# Patient Record
Sex: Female | Born: 1975 | Hispanic: No | Marital: Married | Smoking: Never smoker
Health system: Southern US, Community
[De-identification: ages and names within clinical notes are randomized; demographics above are authoritative.]

## PROBLEM LIST (undated history)

## (undated) DIAGNOSIS — E7800 Pure hypercholesterolemia, unspecified: Secondary | ICD-10-CM

## (undated) HISTORY — PX: ABDOMINAL HYSTERECTOMY: SHX81

---

## 2010-01-31 ENCOUNTER — Ambulatory Visit: Payer: Self-pay | Admitting: Obstetrics & Gynecology

## 2010-02-01 ENCOUNTER — Encounter: Admission: RE | Admit: 2010-02-01 | Discharge: 2010-05-02 | Payer: Self-pay | Admitting: Obstetrics & Gynecology

## 2010-02-07 ENCOUNTER — Ambulatory Visit: Payer: Self-pay | Admitting: Obstetrics & Gynecology

## 2010-02-07 LAB — CONVERTED CEMR LAB
HCT: 33.1 % — ABNORMAL LOW (ref 36.0–46.0)
Hemoglobin: 10.6 g/dL — ABNORMAL LOW (ref 12.0–15.0)
MCHC: 32 g/dL (ref 30.0–36.0)
MCV: 79.2 fL (ref 78.0–100.0)
Platelets: 214 10*3/uL (ref 150–400)
RBC: 4.18 M/uL (ref 3.87–5.11)
RDW: 13.6 % (ref 11.5–15.5)
WBC: 10.2 10*3/uL (ref 4.0–10.5)

## 2010-02-08 ENCOUNTER — Ambulatory Visit (HOSPITAL_COMMUNITY): Admission: RE | Admit: 2010-02-08 | Discharge: 2010-02-08 | Payer: Self-pay | Admitting: Obstetrics & Gynecology

## 2010-02-14 ENCOUNTER — Ambulatory Visit: Payer: Self-pay | Admitting: Obstetrics & Gynecology

## 2010-02-15 ENCOUNTER — Encounter: Payer: Self-pay | Admitting: Obstetrics & Gynecology

## 2010-02-15 LAB — CONVERTED CEMR LAB
Chlamydia, DNA Probe: NEGATIVE
GC Probe Amp, Genital: NEGATIVE
Trich, Wet Prep: NONE SEEN
Yeast Wet Prep HPF POC: NONE SEEN

## 2010-02-21 ENCOUNTER — Ambulatory Visit: Payer: Self-pay | Admitting: Obstetrics & Gynecology

## 2010-02-28 ENCOUNTER — Ambulatory Visit: Payer: Self-pay | Admitting: Obstetrics & Gynecology

## 2010-03-03 ENCOUNTER — Ambulatory Visit: Payer: Self-pay | Admitting: Obstetrics & Gynecology

## 2010-03-07 ENCOUNTER — Ambulatory Visit: Payer: Self-pay | Admitting: Obstetrics & Gynecology

## 2010-03-10 ENCOUNTER — Ambulatory Visit: Payer: Self-pay | Admitting: Family Medicine

## 2010-03-15 ENCOUNTER — Ambulatory Visit: Payer: Self-pay | Admitting: Obstetrics & Gynecology

## 2010-03-17 ENCOUNTER — Ambulatory Visit: Payer: Self-pay | Admitting: Obstetrics & Gynecology

## 2010-03-17 ENCOUNTER — Ambulatory Visit (HOSPITAL_COMMUNITY): Admission: RE | Admit: 2010-03-17 | Discharge: 2010-03-17 | Payer: Self-pay | Admitting: Obstetrics and Gynecology

## 2010-03-21 ENCOUNTER — Ambulatory Visit: Payer: Self-pay | Admitting: Obstetrics & Gynecology

## 2010-03-21 LAB — CONVERTED CEMR LAB
Chlamydia, DNA Probe: NEGATIVE
GC Probe Amp, Genital: NEGATIVE

## 2010-03-24 ENCOUNTER — Ambulatory Visit: Payer: Self-pay | Admitting: Obstetrics & Gynecology

## 2010-03-28 ENCOUNTER — Ambulatory Visit: Payer: Self-pay | Admitting: Obstetrics & Gynecology

## 2010-03-31 ENCOUNTER — Ambulatory Visit: Payer: Self-pay | Admitting: Family Medicine

## 2010-04-04 ENCOUNTER — Ambulatory Visit: Payer: Self-pay | Admitting: Obstetrics & Gynecology

## 2010-04-05 ENCOUNTER — Inpatient Hospital Stay (HOSPITAL_COMMUNITY): Admission: AD | Admit: 2010-04-05 | Discharge: 2010-04-05 | Payer: Self-pay | Admitting: Obstetrics & Gynecology

## 2010-04-07 ENCOUNTER — Encounter: Payer: Self-pay | Admitting: Obstetrics and Gynecology

## 2010-04-07 ENCOUNTER — Inpatient Hospital Stay (HOSPITAL_COMMUNITY): Admission: AD | Admit: 2010-04-07 | Discharge: 2010-04-09 | Payer: Self-pay | Admitting: Obstetrics and Gynecology

## 2010-04-07 ENCOUNTER — Ambulatory Visit: Payer: Self-pay | Admitting: Family Medicine

## 2010-05-27 ENCOUNTER — Ambulatory Visit: Payer: Self-pay | Admitting: Obstetrics & Gynecology

## 2010-06-22 ENCOUNTER — Encounter: Payer: Self-pay | Admitting: Obstetrics & Gynecology

## 2010-06-22 ENCOUNTER — Ambulatory Visit: Payer: Self-pay | Admitting: Obstetrics and Gynecology

## 2010-06-22 LAB — CONVERTED CEMR LAB
Glucose, 2 hour: 116 mg/dL (ref 70–139)
Glucose, Fasting: 77 mg/dL (ref 70–99)

## 2010-11-06 ENCOUNTER — Encounter: Payer: Self-pay | Admitting: *Deleted

## 2011-01-01 LAB — CBC
HCT: 34.2 % — ABNORMAL LOW (ref 36.0–46.0)
Hemoglobin: 11.3 g/dL — ABNORMAL LOW (ref 12.0–15.0)
MCH: 24.5 pg — ABNORMAL LOW (ref 26.0–34.0)
MCHC: 33 g/dL (ref 30.0–36.0)
MCV: 74.2 fL — ABNORMAL LOW (ref 78.0–100.0)
Platelets: 162 10*3/uL (ref 150–400)
RBC: 4.61 MIL/uL (ref 3.87–5.11)
RDW: 15.6 % — ABNORMAL HIGH (ref 11.5–15.5)
WBC: 9.3 10*3/uL (ref 4.0–10.5)

## 2011-01-01 LAB — GLUCOSE, CAPILLARY
Glucose-Capillary: 119 mg/dL — ABNORMAL HIGH (ref 70–99)
Glucose-Capillary: 137 mg/dL — ABNORMAL HIGH (ref 70–99)
Glucose-Capillary: 155 mg/dL — ABNORMAL HIGH (ref 70–99)
Glucose-Capillary: 197 mg/dL — ABNORMAL HIGH (ref 70–99)
Glucose-Capillary: 39 mg/dL — CL (ref 70–99)
Glucose-Capillary: 51 mg/dL — ABNORMAL LOW (ref 70–99)
Glucose-Capillary: 51 mg/dL — ABNORMAL LOW (ref 70–99)
Glucose-Capillary: 59 mg/dL — ABNORMAL LOW (ref 70–99)
Glucose-Capillary: 64 mg/dL — ABNORMAL LOW (ref 70–99)
Glucose-Capillary: 67 mg/dL — ABNORMAL LOW (ref 70–99)
Glucose-Capillary: 73 mg/dL (ref 70–99)
Glucose-Capillary: 91 mg/dL (ref 70–99)
Glucose-Capillary: 91 mg/dL (ref 70–99)

## 2011-01-01 LAB — POCT URINALYSIS DIP (DEVICE)
Bilirubin Urine: NEGATIVE
Glucose, UA: NEGATIVE mg/dL
Hgb urine dipstick: NEGATIVE
Ketones, ur: NEGATIVE mg/dL
Nitrite: NEGATIVE
Protein, ur: NEGATIVE mg/dL
Specific Gravity, Urine: 1.01 (ref 1.005–1.030)
Urobilinogen, UA: 0.2 mg/dL (ref 0.0–1.0)
pH: 6 (ref 5.0–8.0)

## 2011-01-01 LAB — RPR: RPR Ser Ql: NONREACTIVE

## 2011-01-02 LAB — POCT URINALYSIS DIP (DEVICE)
Bilirubin Urine: NEGATIVE
Bilirubin Urine: NEGATIVE
Bilirubin Urine: NEGATIVE
Bilirubin Urine: NEGATIVE
Glucose, UA: NEGATIVE mg/dL
Glucose, UA: NEGATIVE mg/dL
Glucose, UA: 100 mg/dL — AB
Glucose, UA: 500 mg/dL — AB
Hgb urine dipstick: NEGATIVE
Hgb urine dipstick: NEGATIVE
Hgb urine dipstick: NEGATIVE
Hgb urine dipstick: NEGATIVE
Ketones, ur: NEGATIVE mg/dL
Ketones, ur: NEGATIVE mg/dL
Ketones, ur: NEGATIVE mg/dL
Ketones, ur: NEGATIVE mg/dL
Nitrite: NEGATIVE
Nitrite: NEGATIVE
Nitrite: NEGATIVE
Nitrite: NEGATIVE
Protein, ur: NEGATIVE mg/dL
Protein, ur: NEGATIVE mg/dL
Protein, ur: NEGATIVE mg/dL
Protein, ur: NEGATIVE mg/dL
Specific Gravity, Urine: 1.015 (ref 1.005–1.030)
Specific Gravity, Urine: 1.015 (ref 1.005–1.030)
Specific Gravity, Urine: 1.015 (ref 1.005–1.030)
Specific Gravity, Urine: 1.025 (ref 1.005–1.030)
Urobilinogen, UA: 0.2 mg/dL (ref 0.0–1.0)
Urobilinogen, UA: 0.2 mg/dL (ref 0.0–1.0)
Urobilinogen, UA: 0.2 mg/dL (ref 0.0–1.0)
Urobilinogen, UA: 0.2 mg/dL (ref 0.0–1.0)
pH: 5.5 (ref 5.0–8.0)
pH: 5.5 (ref 5.0–8.0)
pH: 5 (ref 5.0–8.0)
pH: 5 (ref 5.0–8.0)

## 2011-01-03 LAB — POCT URINALYSIS DIP (DEVICE)
Bilirubin Urine: NEGATIVE
Bilirubin Urine: NEGATIVE
Bilirubin Urine: NEGATIVE
Bilirubin Urine: NEGATIVE
Bilirubin Urine: NEGATIVE
Glucose, UA: NEGATIVE mg/dL
Glucose, UA: NEGATIVE mg/dL
Glucose, UA: NEGATIVE mg/dL
Glucose, UA: NEGATIVE mg/dL
Glucose, UA: NEGATIVE mg/dL
Hgb urine dipstick: NEGATIVE
Hgb urine dipstick: NEGATIVE
Hgb urine dipstick: NEGATIVE
Hgb urine dipstick: NEGATIVE
Hgb urine dipstick: NEGATIVE
Ketones, ur: NEGATIVE mg/dL
Ketones, ur: NEGATIVE mg/dL
Ketones, ur: NEGATIVE mg/dL
Ketones, ur: NEGATIVE mg/dL
Ketones, ur: NEGATIVE mg/dL
Nitrite: NEGATIVE
Nitrite: NEGATIVE
Nitrite: NEGATIVE
Nitrite: NEGATIVE
Nitrite: NEGATIVE
Protein, ur: NEGATIVE mg/dL
Protein, ur: NEGATIVE mg/dL
Protein, ur: NEGATIVE mg/dL
Protein, ur: NEGATIVE mg/dL
Protein, ur: NEGATIVE mg/dL
Specific Gravity, Urine: 1.01 (ref 1.005–1.030)
Specific Gravity, Urine: 1.015 (ref 1.005–1.030)
Specific Gravity, Urine: 1.025 (ref 1.005–1.030)
Specific Gravity, Urine: <=1.005 (ref 1.005–1.030)
Specific Gravity, Urine: <=1.005 (ref 1.005–1.030)
Urobilinogen, UA: 0.2 mg/dL (ref 0.0–1.0)
Urobilinogen, UA: 0.2 mg/dL (ref 0.0–1.0)
Urobilinogen, UA: 0.2 mg/dL (ref 0.0–1.0)
Urobilinogen, UA: 0.2 mg/dL (ref 0.0–1.0)
Urobilinogen, UA: 0.2 mg/dL (ref 0.0–1.0)
pH: 5.5 (ref 5.0–8.0)
pH: 5.5 (ref 5.0–8.0)
pH: 5 (ref 5.0–8.0)
pH: 5.5 (ref 5.0–8.0)
pH: 5.5 (ref 5.0–8.0)

## 2011-06-22 IMAGING — US US OB DETAIL+14 WK
2 series · 14 of 28 positions shown · non-contrast
Comparison: none

OBSTETRICAL ULTRASOUND:
 This ultrasound exam was performed in the [HOSPITAL] Ultrasound Department.  The OB US report was generated in the AS system, and faxed to the ordering physician.  This report is also available in [HOSPITAL]?s Access[employer]ware and in [REDACTED] PACS.

[Series 1: us ob detail +14 wk · 13 of 77 slices shown (1 of 2)]
[im 4/77]
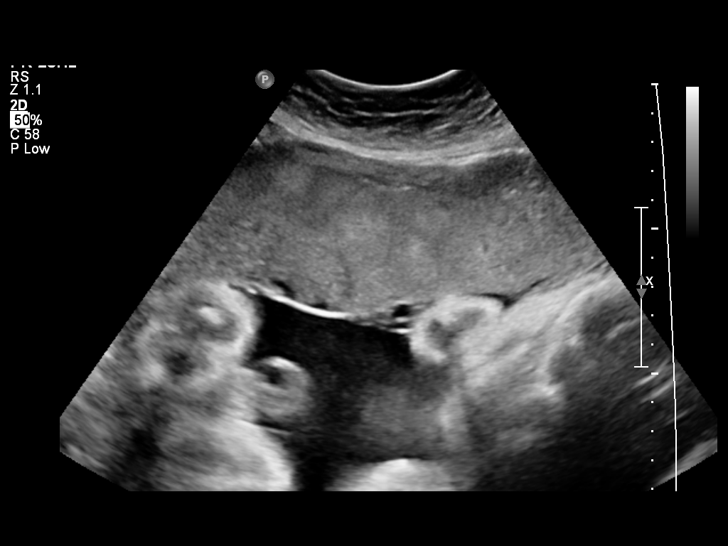
[im 10/77]
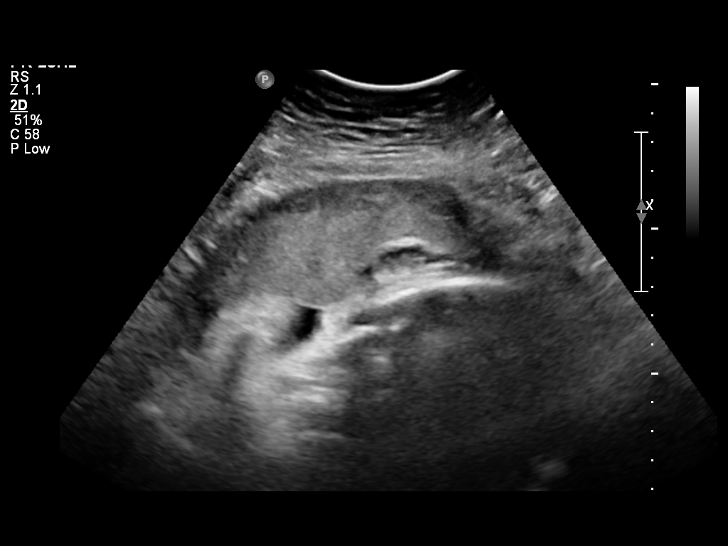
[im 16/77]
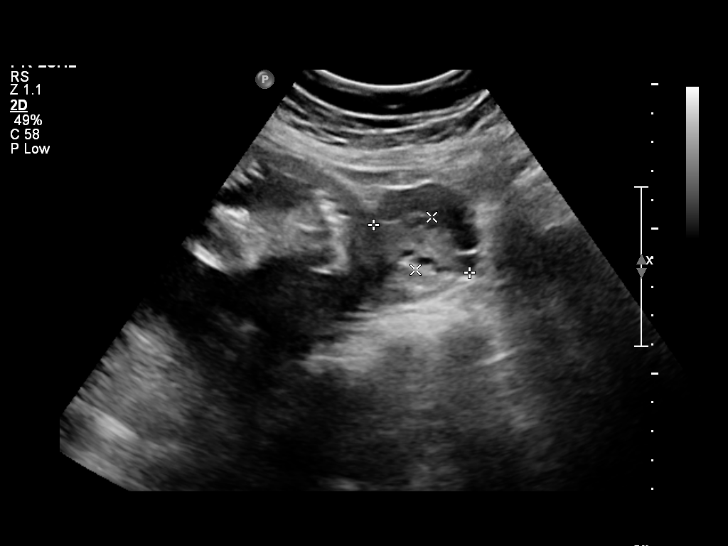
[im 22/77]
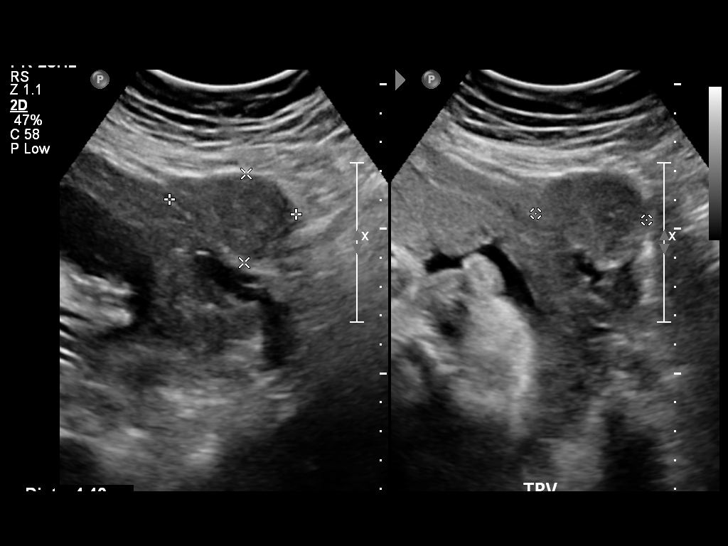
[im 28/77]
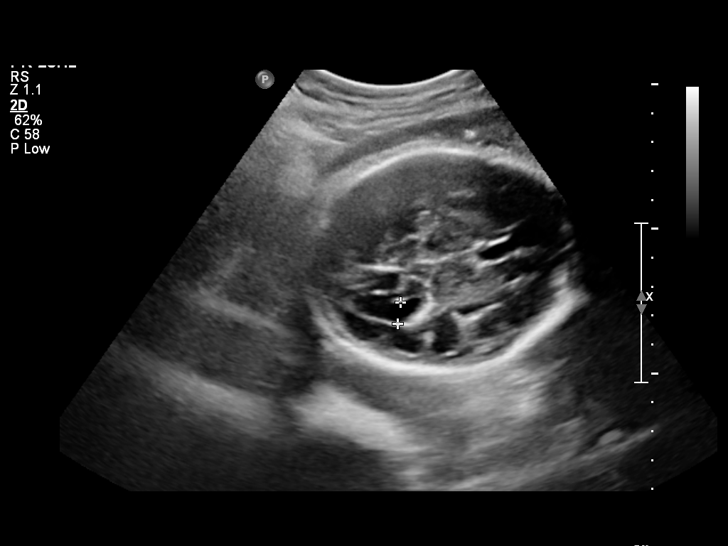
[im 34/77]
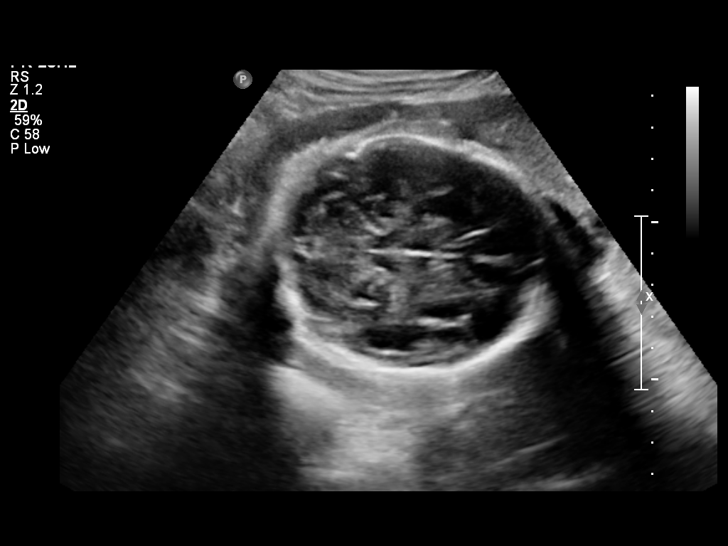
[im 40/77]
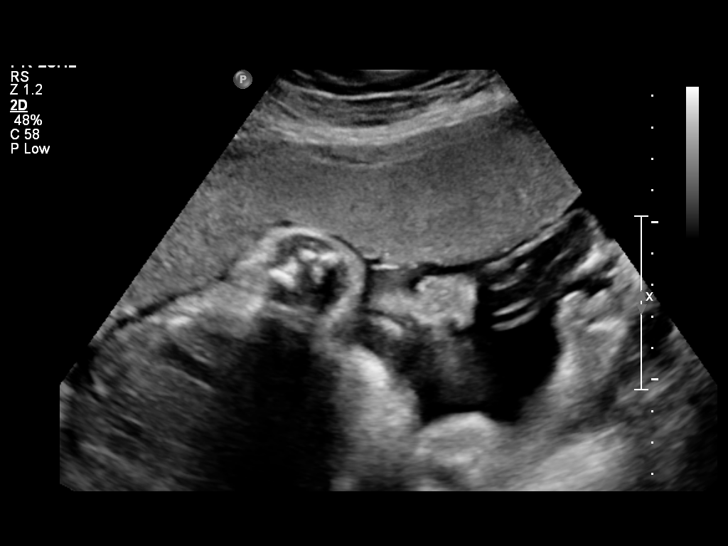
[im 46/77]
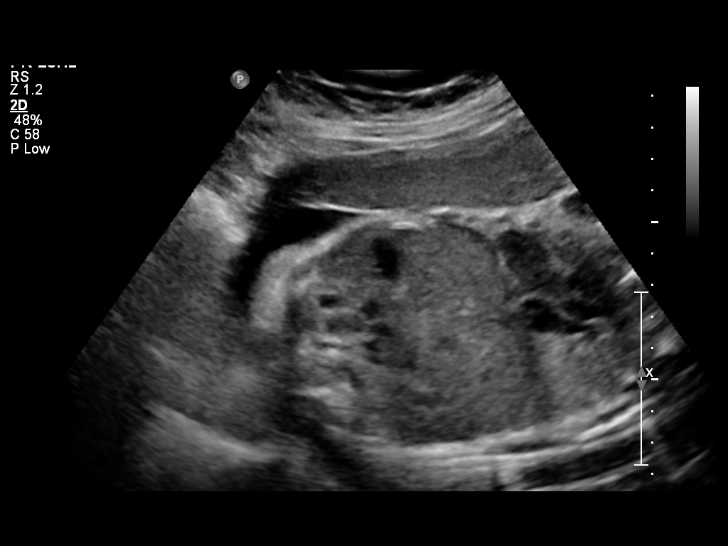
[im 52/77]
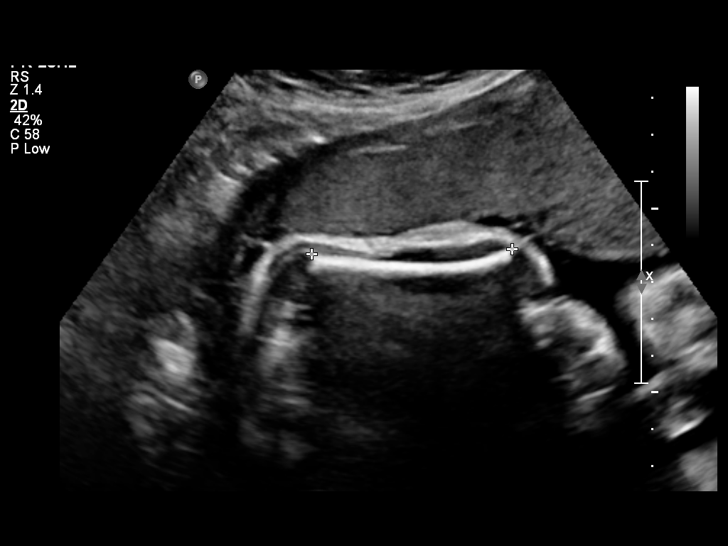
[im 58/77]
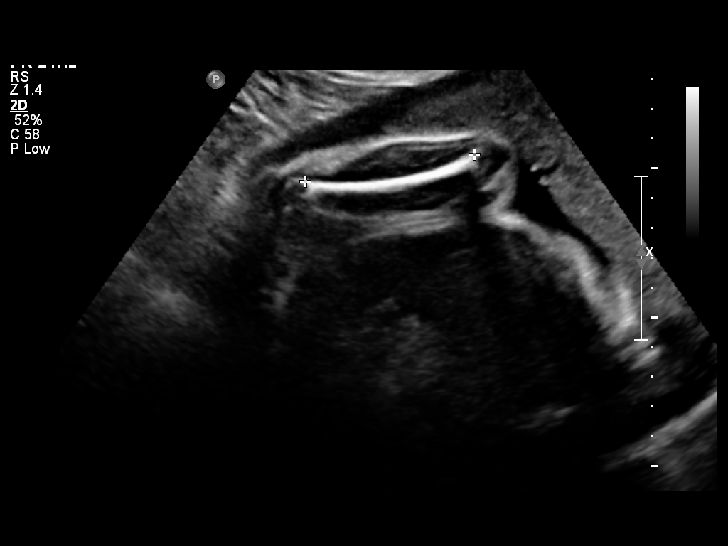
[im 64/77]
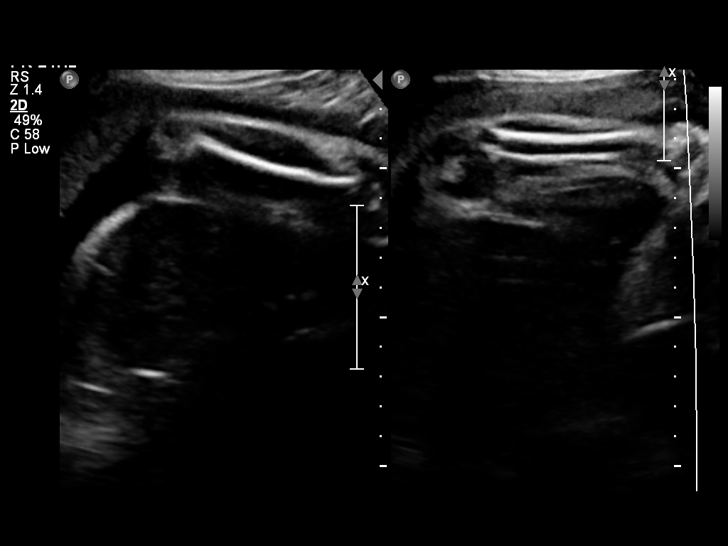
[im 70/77]
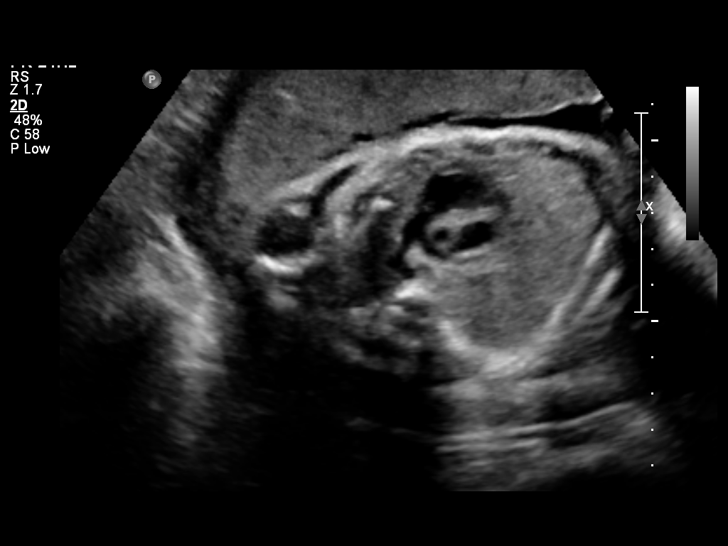
[im 77/77]
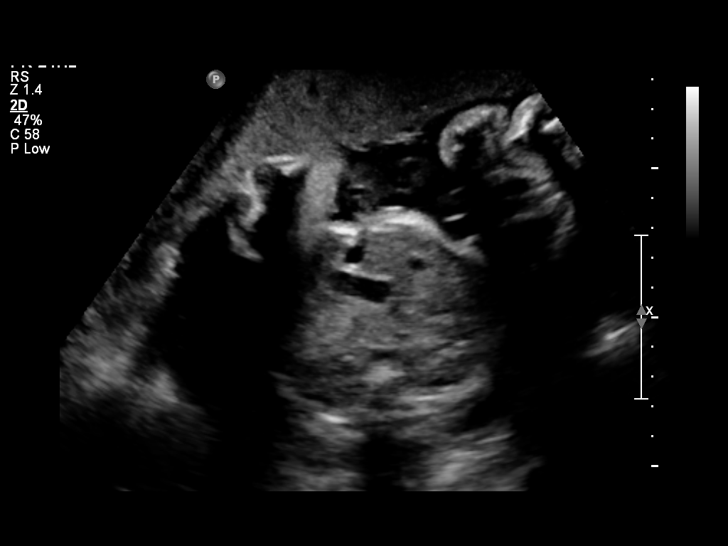

[Series 1: us ob detail +14 wk · 1 of 7 slices shown (2 of 2)]
[im 7/7]
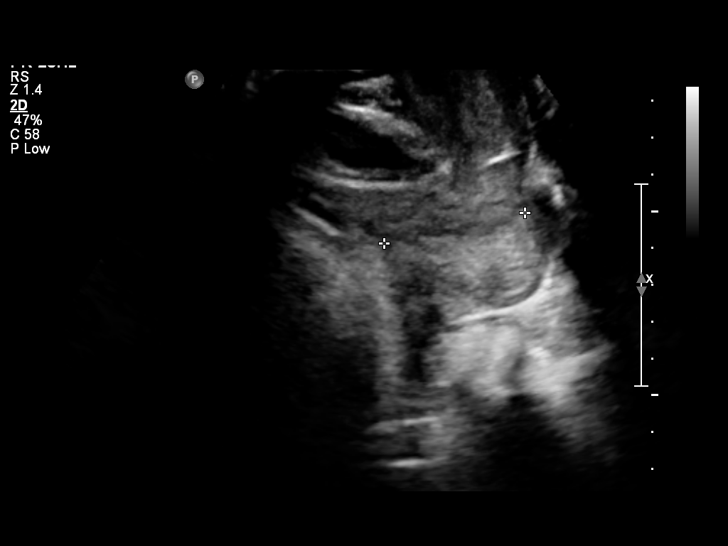

[14 of 28 positions shown; findings below may reference images not displayed]

IMPRESSION: See AS Obstetric US report.

## 2017-07-12 ENCOUNTER — Encounter (HOSPITAL_BASED_OUTPATIENT_CLINIC_OR_DEPARTMENT_OTHER): Payer: Self-pay

## 2017-07-12 ENCOUNTER — Emergency Department (HOSPITAL_BASED_OUTPATIENT_CLINIC_OR_DEPARTMENT_OTHER)
Admission: EM | Admit: 2017-07-12 | Discharge: 2017-07-12 | Disposition: A | Discharge status: Home / Self Care | Payer: BLUE CROSS/BLUE SHIELD | Attending: Emergency Medicine | Admitting: Emergency Medicine

## 2017-07-12 DIAGNOSIS — Z79899 Other long term (current) drug therapy: Secondary | ICD-10-CM | POA: Insufficient documentation

## 2017-07-12 DIAGNOSIS — R1013 Epigastric pain: Secondary | ICD-10-CM | POA: Diagnosis not present

## 2017-07-12 HISTORY — DX: Pure hypercholesterolemia, unspecified: E78.00

## 2017-07-12 LAB — CBC
HCT: 37.8 % (ref 36.0–46.0)
Hemoglobin: 12.6 g/dL (ref 12.0–15.0)
MCH: 26.5 pg (ref 26.0–34.0)
MCHC: 33.3 g/dL (ref 30.0–36.0)
MCV: 79.4 fL (ref 78.0–100.0)
Platelets: 253 10*3/uL (ref 150–400)
RBC: 4.76 MIL/uL (ref 3.87–5.11)
RDW: 14.1 % (ref 11.5–15.5)
WBC: 10.1 10*3/uL (ref 4.0–10.5)

## 2017-07-12 LAB — COMPREHENSIVE METABOLIC PANEL
ALT: 23 U/L (ref 14–54)
AST: 20 U/L (ref 15–41)
Albumin: 4 g/dL (ref 3.5–5.0)
Alkaline Phosphatase: 51 U/L (ref 38–126)
Anion gap: 8 (ref 5–15)
BUN: 8 mg/dL (ref 6–20)
CO2: 24 mmol/L (ref 22–32)
Calcium: 9.4 mg/dL (ref 8.9–10.3)
Chloride: 106 mmol/L (ref 101–111)
Creatinine, Ser: 0.65 mg/dL (ref 0.44–1.00)
GFR calc Af Amer: >60 mL/min (ref >60)
GFR calc non Af Amer: >60 mL/min (ref >60)
Glucose, Bld: 94 mg/dL (ref 65–99)
Potassium: 3.8 mmol/L (ref 3.5–5.1)
Sodium: 138 mmol/L (ref 135–145)
Total Bilirubin: 0.4 mg/dL (ref 0.3–1.2)
Total Protein: 7.5 g/dL (ref 6.5–8.1)

## 2017-07-12 LAB — URINALYSIS, MICROSCOPIC (REFLEX)
RBC / HPF: NONE SEEN RBC/hpf (ref 0–5)
WBC, UA: NONE SEEN WBC/hpf (ref 0–5)

## 2017-07-12 LAB — URINALYSIS, ROUTINE W REFLEX MICROSCOPIC
Bilirubin Urine: NEGATIVE
Glucose, UA: NEGATIVE mg/dL
Ketones, ur: NEGATIVE mg/dL
Leukocytes, UA: NEGATIVE
Nitrite: NEGATIVE
Protein, ur: NEGATIVE mg/dL
Specific Gravity, Urine: <1.005 — ABNORMAL LOW (ref 1.005–1.030)
pH: 6 (ref 5.0–8.0)

## 2017-07-12 LAB — LIPASE, BLOOD: Lipase: 32 U/L (ref 11–51)

## 2017-07-12 MED ORDER — GI COCKTAIL ~~LOC~~
30.0000 mL | Freq: Once | ORAL | Status: AC
  Administered 2017-07-12: 30 mL via ORAL
  Filled 2017-07-12: qty 30

## 2017-07-12 NOTE — ED Provider Notes (Signed)
[facility] Provider Note   CSN: [id number] Arrival date & time: 07/12/17  1147     History   Chief Complaint Chief Complaint  Patient presents with  . Abdominal Pain    HPI Nicole Hobbs is a 41 y.o. female.  HPI   Patient presenting with epigastric pain. She states she has had this epigastric pain for about one month. The pain radiates to the right side at times and also is associated with pressure around her epigastric area, especially when she eats food. She does not have it constantly, usually it comes once every week and last for several hours and then resolves. She states that with this epigastric pain she has nausea and vomiting. Most recently she has had epigastric pain last night. Pain started around 12 PM when she went to bed. She usually eats dinner at around 8/9 and states that she did not have rich/spicy food for dinner. She was seen by her primary care physician for this pain a couple weeks ago. States she had a right upper quadrant ultrasound which was negative. She also has a history of reflux however she states that reflux usually is associated with burning sensation in her esophagus. This did not feel like reflux. She has been burping a lot lately. She states that this is also associated with her reflux. She denies any fevers, denies any chills. Patient has been prescribed omeprazole heart rate does not take it consistently. She recently received a prescription of clarithromycin from her PCP though she is not sure what for.   Past Medical History:  Diagnosis Date  . High cholesterol     There are no active problems to display for this patient.   Past Surgical History:  Procedure Laterality Date  . ABDOMINAL HYSTERECTOMY      OB History    No data available       Home Medications    Prior to Admission medications   Medication Sig Start Date End Date Taking? Authorizing Provider  clarithromycin (BIAXIN) 500 MG tablet Take 500 mg by mouth 2  (two) times daily.   Yes [provider]    Family History No family history on file.  Social History Social History  Substance Use Topics  . Smoking status: Never Smoker  . Smokeless tobacco: Never Used  . Alcohol use No     Allergies   Patient has no known allergies.   Review of Systems Review of Systems  Constitutional: Negative for chills and fever.  HENT: Negative for congestion.   Respiratory: Negative for cough.   Gastrointestinal: Positive for abdominal pain, nausea and vomiting.  Genitourinary: Negative for dysuria and flank pain.     Physical Exam Updated Vital Signs BP 132/89   Pulse 94   Temp 98.8 F (37.1 C) (Oral)   Resp 14   Ht  (1.6 m)   Wt 83.7 kg (184 lb 8.4 oz)   SpO2 100%   BMI 32.69 kg/m   Physical Exam  Constitutional: She appears well-developed and well-nourished. No distress.  HENT:  Head: Normocephalic and atraumatic.  Eyes: Conjunctivae are normal.  Neck: Neck supple.  Cardiovascular: Normal rate and regular rhythm.   No murmur heard. Pulmonary/Chest: Effort normal and breath sounds normal. No respiratory distress.  Abdominal: Soft. There is no tenderness.  Musculoskeletal: She exhibits no edema.  Neurological: She is alert.  Skin: Skin is warm and dry.  Psychiatric: She has a normal mood and affect.  Nursing note and vitals  reviewed.    ED Treatments / Results  Labs (all labs ordered are listed, but only abnormal results are displayed) Labs Reviewed  URINALYSIS, ROUTINE W REFLEX MICROSCOPIC - Abnormal; Notable for the following:       Result Value   Specific Gravity, Urine <1.005 (*)    Hgb urine dipstick TRACE (*)    All other components within normal limits  URINALYSIS, MICROSCOPIC (REFLEX) - Abnormal; Notable for the following:    Bacteria, UA RARE (*)    Squamous Epithelial / LPF 0-5 (*)    All other components within normal limits  CBC  COMPREHENSIVE METABOLIC PANEL  LIPASE, BLOOD    EKG  EKG  Interpretation  Date/Time:  Thursday July 12 2017 12:08:13 EDT Ventricular Rate:  101 PR Interval:    QRS Duration: 84 QT Interval:  353 QTC Calculation: 458 R Axis:   44 Text Interpretation:  Sinus tachycardia Probable left atrial enlargement Borderline T abnormalities, diffuse leads NO STEMI No old tracing to compare Confirmed by Drema Pry [id number]) on 07/12/2017 12:40:15 PM       Radiology No results found.  Procedures Procedures (including critical care time)  Medications Ordered in ED Medications  gi cocktail (Maalox,Lidocaine,Donnatal) (30 mLs Oral Given 07/12/17 1357)     Initial Impression / Assessment and Plan / ED Course  I have reviewed the triage vital signs and the nursing notes.  Pertinent labs & imaging results that were available during my care of the patient were reviewed by me and considered in my medical decision making (see chart for details).  Patient presenting with epigastric pain which radiates the right side, intermittent. She was prescribed omeprazole and clathromycin from PCP she is unsure what for. She has only taken 1 pill of clarithromycin.She states she's had a recent right upper quadrant ultrasound about 2 weeks ago which was negative for gallstones repeat bedside ultrasound not showing any significant stones or gallbladder wall thickening. The patient's lab work was within normal limits. Patient had an EKG that was within normal limits. Patient states that the discomfort improved with GI cocktail. No significant concerns for acute abdomen patient to follow-up PCP in the next week and to discuss a referral to GI.  Final Clinical Impressions(s) / ED Diagnoses   Final diagnoses:  Epigastric pain    New Prescriptions New Prescriptions   No medications on file     Berton Bon, MD 07/12/17 1510    Nira Conn, MD 07/13/17 [phone]

## 2017-07-12 NOTE — Discharge Instructions (Signed)
Please ensure that you take her omeprazole as discussed today. I would continue the clarithromycin that was prescribed by your PCP. Please follow-up in the next week with her PCP. In the meantime you can use Maalox as needed for possible gas.

## 2017-07-12 NOTE — ED Triage Notes (Addendum)
C/o epigastric pain x 2 weeks, n/v-"feels like food is stuck"-was seen by PCP 9/24-has rx for abx but unsure what is being treated-NAD-steady gait

## 2017-07-12 NOTE — ED Notes (Signed)
ED Provider at bedside. 

## 2019-06-25 ENCOUNTER — Other Ambulatory Visit: Payer: Self-pay

## 2019-06-25 DIAGNOSIS — Z20822 Contact with and (suspected) exposure to covid-19: Secondary | ICD-10-CM

## 2019-06-26 LAB — NOVEL CORONAVIRUS, NAA: SARS-CoV-2, NAA: NOT DETECTED

## 2021-02-12 ENCOUNTER — Other Ambulatory Visit: Payer: Self-pay

## 2021-02-12 ENCOUNTER — Emergency Department (HOSPITAL_COMMUNITY): Payer: BLUE CROSS/BLUE SHIELD | Admitting: Anesthesiology

## 2021-02-12 ENCOUNTER — Emergency Department (HOSPITAL_BASED_OUTPATIENT_CLINIC_OR_DEPARTMENT_OTHER): Payer: BLUE CROSS/BLUE SHIELD

## 2021-02-12 ENCOUNTER — Ambulatory Visit (HOSPITAL_BASED_OUTPATIENT_CLINIC_OR_DEPARTMENT_OTHER)
Admission: EM | Admit: 2021-02-12 | Discharge: 2021-02-12 | Disposition: A | Discharge status: Home / Self Care | Payer: BLUE CROSS/BLUE SHIELD | Attending: Gastroenterology | Admitting: Gastroenterology

## 2021-02-12 ENCOUNTER — Encounter (HOSPITAL_BASED_OUTPATIENT_CLINIC_OR_DEPARTMENT_OTHER): Payer: Self-pay | Admitting: Emergency Medicine

## 2021-02-12 ENCOUNTER — Encounter (HOSPITAL_COMMUNITY)
Admission: EM | Disposition: A | Discharge status: Home / Self Care | Payer: Self-pay | Source: Home / Self Care | Attending: Emergency Medicine

## 2021-02-12 DIAGNOSIS — Z20822 Contact with and (suspected) exposure to covid-19: Secondary | ICD-10-CM | POA: Diagnosis not present

## 2021-02-12 DIAGNOSIS — X58XXXA Exposure to other specified factors, initial encounter: Secondary | ICD-10-CM | POA: Insufficient documentation

## 2021-02-12 DIAGNOSIS — Z791 Long term (current) use of non-steroidal anti-inflammatories (NSAID): Secondary | ICD-10-CM | POA: Insufficient documentation

## 2021-02-12 DIAGNOSIS — Z79899 Other long term (current) drug therapy: Secondary | ICD-10-CM | POA: Diagnosis not present

## 2021-02-12 DIAGNOSIS — R1013 Epigastric pain: Secondary | ICD-10-CM | POA: Insufficient documentation

## 2021-02-12 DIAGNOSIS — R1011 Right upper quadrant pain: Secondary | ICD-10-CM | POA: Diagnosis present

## 2021-02-12 DIAGNOSIS — K219 Gastro-esophageal reflux disease without esophagitis: Secondary | ICD-10-CM | POA: Insufficient documentation

## 2021-02-12 DIAGNOSIS — T188XXA Foreign body in other parts of alimentary tract, initial encounter: Secondary | ICD-10-CM | POA: Diagnosis not present

## 2021-02-12 DIAGNOSIS — T182XXA Foreign body in stomach, initial encounter: Secondary | ICD-10-CM | POA: Diagnosis present

## 2021-02-12 HISTORY — PX: ESOPHAGOGASTRODUODENOSCOPY (EGD) WITH PROPOFOL: SHX5813

## 2021-02-12 LAB — RESP PANEL BY RT-PCR (FLU A&B, COVID) ARPGX2
Influenza A by PCR: NEGATIVE
Influenza B by PCR: NEGATIVE
SARS Coronavirus 2 by RT PCR: NEGATIVE

## 2021-02-12 LAB — CBC WITH DIFFERENTIAL/PLATELET
Abs Immature Granulocytes: 0.03 10*3/uL (ref 0.00–0.07)
Basophils Absolute: 0 10*3/uL (ref 0.0–0.1)
Basophils Relative: 0 %
Eosinophils Absolute: 0.1 10*3/uL (ref 0.0–0.5)
Eosinophils Relative: 2 %
HCT: 39.2 % (ref 36.0–46.0)
Hemoglobin: 13.2 g/dL (ref 12.0–15.0)
Immature Granulocytes: 0 %
Lymphocytes Relative: 30 %
Lymphs Abs: 2.3 10*3/uL (ref 0.7–4.0)
MCH: 27.4 pg (ref 26.0–34.0)
MCHC: 33.7 g/dL (ref 30.0–36.0)
MCV: 81.3 fL (ref 80.0–100.0)
Monocytes Absolute: 0.5 10*3/uL (ref 0.1–1.0)
Monocytes Relative: 7 %
Neutro Abs: 4.6 10*3/uL (ref 1.7–7.7)
Neutrophils Relative %: 61 %
Platelets: 224 10*3/uL (ref 150–400)
RBC: 4.82 MIL/uL (ref 3.87–5.11)
RDW: 12.7 % (ref 11.5–15.5)
WBC: 7.7 10*3/uL (ref 4.0–10.5)
nRBC: 0 % (ref 0.0–0.2)

## 2021-02-12 LAB — COMPREHENSIVE METABOLIC PANEL
ALT: 25 U/L (ref 0–44)
AST: 26 U/L (ref 15–41)
Albumin: 3.9 g/dL (ref 3.5–5.0)
Alkaline Phosphatase: 39 U/L (ref 38–126)
Anion gap: 9 (ref 5–15)
BUN: 24 mg/dL — ABNORMAL HIGH (ref 6–20)
CO2: 23 mmol/L (ref 22–32)
Calcium: 9.6 mg/dL (ref 8.9–10.3)
Chloride: 104 mmol/L (ref 98–111)
Creatinine, Ser: 0.68 mg/dL (ref 0.44–1.00)
GFR, Estimated: >60 mL/min (ref >60)
Glucose, Bld: 130 mg/dL — ABNORMAL HIGH (ref 70–99)
Potassium: 3.3 mmol/L — ABNORMAL LOW (ref 3.5–5.1)
Sodium: 136 mmol/L (ref 135–145)
Total Bilirubin: 0.3 mg/dL (ref 0.3–1.2)
Total Protein: 7 g/dL (ref 6.5–8.1)

## 2021-02-12 LAB — TROPONIN I (HIGH SENSITIVITY)
Troponin I (High Sensitivity): <2 ng/L (ref <18)
Troponin I (High Sensitivity): <2 ng/L (ref <18)

## 2021-02-12 LAB — LIPASE, BLOOD: Lipase: 44 U/L (ref 11–51)

## 2021-02-12 LAB — HCG, SERUM, QUALITATIVE: Preg, Serum: NEGATIVE

## 2021-02-12 SURGERY — ESOPHAGOGASTRODUODENOSCOPY (EGD) WITH PROPOFOL
Anesthesia: General

## 2021-02-12 MED ORDER — ALUM & MAG HYDROXIDE-SIMETH 200-200-20 MG/5ML PO SUSP
30.0000 mL | Freq: Once | ORAL | Status: AC
  Administered 2021-02-12: 30 mL via ORAL
  Filled 2021-02-12: qty 30

## 2021-02-12 MED ORDER — ONDANSETRON HCL 4 MG/2ML IJ SOLN
4.0000 mg | Freq: Once | INTRAMUSCULAR | Status: AC
  Administered 2021-02-12: 4 mg via INTRAVENOUS
  Filled 2021-02-12: qty 2

## 2021-02-12 MED ORDER — LACTATED RINGERS IV SOLN: INTRAVENOUS | Status: DC

## 2021-02-12 MED ORDER — FENTANYL CITRATE (PF) 100 MCG/2ML IJ SOLN
50.0000 ug | Freq: Once | INTRAMUSCULAR | Status: AC
  Administered 2021-02-12: 50 ug via INTRAVENOUS
  Filled 2021-02-12: qty 2

## 2021-02-12 MED ORDER — DEXAMETHASONE SODIUM PHOSPHATE 10 MG/ML IJ SOLN
INTRAMUSCULAR | Status: DC | PRN
  Administered 2021-02-12: 5 mg via INTRAVENOUS

## 2021-02-12 MED ORDER — LIDOCAINE 2% (20 MG/ML) 5 ML SYRINGE
INTRAMUSCULAR | Status: DC | PRN
  Administered 2021-02-12: 60 mg via INTRAVENOUS

## 2021-02-12 MED ORDER — IOHEXOL 300 MG/ML  SOLN
100.0000 mL | Freq: Once | INTRAMUSCULAR | Status: AC | PRN
  Administered 2021-02-12: 100 mL via INTRAVENOUS

## 2021-02-12 MED ORDER — PROPOFOL 10 MG/ML IV BOLUS
INTRAVENOUS | Status: DC | PRN
  Administered 2021-02-12: 150 mg via INTRAVENOUS

## 2021-02-12 MED ORDER — SUCCINYLCHOLINE CHLORIDE 200 MG/10ML IV SOSY
PREFILLED_SYRINGE | INTRAVENOUS | Status: DC | PRN
  Administered 2021-02-12: 80 mg via INTRAVENOUS

## 2021-02-12 MED ORDER — SODIUM CHLORIDE 0.9 % IV SOLN: INTRAVENOUS | Status: DC

## 2021-02-12 MED ORDER — ONDANSETRON HCL 4 MG/2ML IJ SOLN
INTRAMUSCULAR | Status: DC | PRN
  Administered 2021-02-12: 4 mg via INTRAVENOUS

## 2021-02-12 SURGICAL SUPPLY — 14 items

## 2021-02-12 NOTE — Anesthesia Preprocedure Evaluation (Addendum)
Anesthesia Evaluation  Patient identified by MRN, date of birth, ID band Patient awake    Reviewed: Allergy & Precautions, NPO status , Patient's Chart, lab work & pertinent test results  Airway Mallampati: III  TM Distance: >3 FB Neck ROM: Full    Dental no notable dental hx.    Pulmonary neg pulmonary ROS,    Pulmonary exam normal breath sounds clear to auscultation       Cardiovascular negative cardio ROS Normal cardiovascular exam Rhythm:Regular Rate:Normal  ECG: NSR   Neuro/Psych negative neurological ROS  negative psych ROS   GI/Hepatic negative GI ROS, Neg liver ROS,   Endo/Other  negative endocrine ROS  Renal/GU negative Renal ROS     Musculoskeletal negative musculoskeletal ROS (+)   Abdominal   Peds  Hematology negative hematology ROS (+)   Anesthesia Other Findings Foreign Body in Stomach  Reproductive/Obstetrics hcg negative                            Anesthesia Physical Anesthesia Plan  ASA: II and emergent  Anesthesia Plan: General   Post-op Pain Management:    Induction: Intravenous and Rapid sequence  PONV Risk Score and Plan: 3 and Ondansetron, Dexamethasone, Midazolam and Treatment may vary due to age or medical condition  Airway Management Planned: Oral ETT  Additional Equipment:   Intra-op Plan:   Post-operative Plan: Extubation in OR  Informed Consent: I have reviewed the patients History and Physical, chart, labs and discussed the procedure including the risks, benefits and alternatives for the proposed anesthesia with the patient or authorized representative who has indicated his/her understanding and acceptance.     Dental advisory given  Plan Discussed with: CRNA  Anesthesia Plan Comments: (Anesthetic plan discussed with patient and daughter)       Anesthesia Quick Evaluation

## 2021-02-12 NOTE — ED Provider Notes (Signed)
[facility] EMERGENCY DEPARTMENT Provider Note   CSN: [id number] Arrival date & time: 02/12/21  1194     History Chief Complaint  Patient presents with  . Abdominal Pain    Nicole Hobbs is a 45 y.o. female.  HPI Patient is a 45 year old female presented with a chief complaint of 17 mm possible bone inside of her abdomen.  She denies fevers or chills, nausea vomiting, syncope or shortness of breath.  She was seen at outside hospital and scanned and found the potential object.  GI planning to scope patient on arrival to emergency department.  Patient in no acute distress on arrival at this time. Past Medical History:  Diagnosis Date  . High cholesterol     There are no problems to display for this patient.   Past Surgical History:  Procedure Laterality Date  . ABDOMINAL HYSTERECTOMY       OB History   No obstetric history on file.     History reviewed. No pertinent family history.  Social History   Tobacco Use  . Smoking status: Never Smoker  . Smokeless tobacco: Never Used  Substance Use Topics  . Alcohol use: No  . Drug use: No    Home Medications Prior to Admission medications   Medication Sig Start Date End Date Taking? Authorizing Provider  clarithromycin (BIAXIN) 500 MG tablet Take 500 mg by mouth 2 (two) times daily.    [provider]    Allergies    Patient has no known allergies.  Review of Systems   Review of Systems  Constitutional: Negative for chills and fever.  HENT: Negative for ear pain and sore throat.   Eyes: Negative for pain and visual disturbance.  Respiratory: Negative for cough and shortness of breath.   Cardiovascular: Negative for chest pain and palpitations.  Gastrointestinal: Positive for abdominal pain. Negative for vomiting.  Genitourinary: Negative for dysuria and hematuria.  Musculoskeletal: Negative for arthralgias and back pain.  Skin: Negative for color change and rash.  Neurological:  Negative for seizures and syncope.  All other systems reviewed and are negative.   Physical Exam Updated Vital Signs BP 108/88   Pulse 70   Temp 97.9 F (36.6 C) (Oral)   Resp 16   Ht 5\' 2"  (1.575 m)   Wt 68 kg   SpO2 100%   BMI 27.44 kg/m   Physical Exam Vitals and nursing note reviewed.  Constitutional:      General: She is not in acute distress.    Appearance: She is well-developed.  HENT:     Head: Normocephalic and atraumatic.  Eyes:     Conjunctiva/sclera: Conjunctivae normal.  Cardiovascular:     Rate and Rhythm: Normal rate and regular rhythm.     Heart sounds: No murmur heard.   Pulmonary:     Effort: Pulmonary effort is normal. No respiratory distress.     Breath sounds: Normal breath sounds.  Abdominal:     General: There is no distension.     Palpations: Abdomen is soft.     Tenderness: There is no abdominal tenderness. There is no right CVA tenderness or left CVA tenderness.  Musculoskeletal:        General: No swelling or tenderness. Normal range of motion.     Cervical back: Neck supple.  Skin:    General: Skin is warm and dry.  Neurological:     General: No focal deficit present.     Mental Status: She is alert and  oriented to person, place, and time. Mental status is at baseline.     Cranial Nerves: No cranial nerve deficit.     ED Results / Procedures / Treatments   Labs (all labs ordered are listed, but only abnormal results are displayed) Labs Reviewed  COMPREHENSIVE METABOLIC PANEL - Abnormal; Notable for the following components:      Result Value   Potassium 3.3 (*)    Glucose, Bld 130 (*)    BUN 24 (*)    All other components within normal limits  RESP PANEL BY RT-PCR (FLU A&B, COVID) ARPGX2  CBC WITH DIFFERENTIAL/PLATELET  LIPASE, BLOOD  HCG, SERUM, QUALITATIVE  TROPONIN I (HIGH SENSITIVITY)  TROPONIN I (HIGH SENSITIVITY)    EKG EKG Interpretation  Date/Time:  Saturday February 12 2021 05:39:34 EDT Ventricular Rate:  67 PR  Interval:  159 QRS Duration: 87 QT Interval:  385 QTC Calculation: 407 R Axis:   50 Text Interpretation: Sinus rhythm Confirmed by Palumbo, April ([id number]) on 02/12/2021 5:41:46 AM   Radiology CT ABDOMEN PELVIS W CONTRAST  Result Date: 02/12/2021 CLINICAL DATA:  45 year old female with epigastric abdominal pain acute onset 3 hours ago. EXAM: CT ABDOMEN AND PELVIS WITH CONTRAST TECHNIQUE: Multidetector CT imaging of the abdomen and pelvis was performed using the standard protocol following bolus administration of intravenous contrast. CONTRAST:  OMNIPAQUE IOHEXOL 300 MG/ML  SOLN COMPARISON:  CT Abdomen and Pelvis 02/25/2016. FINDINGS: Lower chest: Negative; minor lung base atelectasis. Hepatobiliary: Negative liver and gallbladder. No bile duct enlargement. Pancreas: Negative. Spleen: Negative. Adrenals/Urinary Tract: Normal adrenal glands. Symmetric renal enhancement, and on delayed images there is symmetric contrast excretion. There is asymmetric prominence of the right renal pelvis, although similar to the 2017 CT. And there is no asymmetric hydroureter, urinary calculus, or other obstructing etiology identified. Unremarkable bladder. Stomach/Bowel: Large bowel loops appear negative aside from retained stool. However, the appendix appears larger than normal, up to 8 mm diameter (series 2, image 55 and coronal image 57. However, it measured 7 mm in 2017. There is suggestion of mucosal hyperenhancement of the distal appendix (coronal image 46), but no convincing para appendiceal inflammation at this time. No appendicoliths. Terminal ileum and other distal small bowel loops appear negative. No dilated small bowel. There is a linear 17-18 mm hyperdense object in the distal stomach on series 2, image 25. No gastroduodenal inflammation. No free air or free fluid. Vascular/Lymphatic: Major arterial structures appear patent and normal. Portal venous system is patent. No lymphadenopathy. Reproductive: Absent  uterus now.  Ovaries are within normal limits. Other: No pelvic free fluid. Musculoskeletal: Mild lower lumbar facet hypertrophy, disc bulging. No acute osseous abnormality identified. IMPRESSION: 1. Linear roughly 17 mm hyperdense foreign body projects in the distal stomach. Query Retained Chicken Bone. No gastro duodenal inflammation. 2. Appendix appears mildly enlarged although not significantly changed from 2017. Suggestion of mucosal hyperenhancement of the appendix, but no convincing regional inflammation. Recommend clinical surveillance, and if the patient's clinical course is equivocal then repeat CT Abdomen and Pelvis in 12-24 hours would be most helpful to exclude early appendicitis. 3. Asymmetric right renal pelvis but no urinary calculus or secondary changes of obstructive uropathy. Favor normal anatomic variant. Electronically Signed   By: Odessa Fleming M.D.   On: 02/12/2021 08:15   DG Chest Portable 1 View  Result Date: 02/12/2021 CLINICAL DATA:  45 year old female with pain. EXAM: PORTABLE CHEST 1 VIEW COMPARISON:  Chest radiographs 08/09/2017. FINDINGS: Portable AP view at 0617 hours. Normal lung  volumes and mediastinal contours. Visualized tracheal air column is within normal limits. Allowing for portable technique the lungs are clear. No pneumothorax. Paucity of bowel gas in the upper abdomen. No osseous abnormality identified. IMPRESSION: Negative portable chest. Electronically Signed   By: Odessa Fleming M.D.   On: 02/12/2021 07:42   US Abdomen Limited RUQ (LIVER/GB)  Result Date: 02/12/2021 CLINICAL DATA:  45 year old female with RIGHT abdominal pain. EXAM: ULTRASOUND ABDOMEN LIMITED RIGHT UPPER QUADRANT COMPARISON:  CT performed today and prior studies FINDINGS: Gallbladder: The gallbladder is unremarkable. There is no evidence of cholelithiasis or cholecystitis. Common bile duct: Diameter: 3 mm. No intrahepatic or extrahepatic biliary dilatation identified. Liver: No focal lesion identified. Within  normal limits in parenchymal echogenicity. Portal vein is patent on color Doppler imaging with normal direction of blood flow towards the liver. Other: Fullness of the RIGHT intrarenal collecting system and renal pelvis appear unchanged from prior CTs dating back to 2017. IMPRESSION: 1. No acute abnormality. 2. Unchanged fullness of the RIGHT intrarenal collecting system and RIGHT renal pelvis prior CTs. Electronically Signed   By: Harmon Pier M.D.   On: 02/12/2021 09:31    Procedures Procedures   Medications Ordered in ED Medications  ondansetron (ZOFRAN) injection 4 mg (4 mg Intravenous Given 02/12/21 0548)  alum & mag hydroxide-simeth (MAALOX/MYLANTA) 200-200-20 MG/5ML suspension 30 mL (30 mLs Oral Given 02/12/21 0548)  fentaNYL (SUBLIMAZE) injection 50 mcg (50 mcg Intravenous Given 02/12/21 0629)  iohexol (OMNIPAQUE) 300 MG/ML solution 100 mL (100 mLs Intravenous Contrast Given 02/12/21 0724)    ED Course  I have reviewed the triage vital signs and the nursing notes.  Pertinent labs & imaging results that were available during my care of the patient were reviewed by me and considered in my medical decision making (see chart for details).    MDM Rules/Calculators/A&P                          Patient history of present illness physical exam findings are most consistent with known sharp object ingestion.  No evidence of perforation emergently on arrival here.  Patient stable for planned endoscopy no indication for emergency surgery referral at this moment.  Patient remained hemodynamically stable while awaiting care in the emergency department.  Patient taken of the endoscopy suite in stable condition. Final Clinical Impression(s) / ED Diagnoses Final diagnoses:  Epigastric pain  Foreign body in stomach, initial encounter    Rx / DC Orders ED Discharge Orders    None       Glyn Ade, MD 02/12/21 [id number]    Arby Barrette, MD 02/15/21 1104

## 2021-02-12 NOTE — Brief Op Note (Signed)
Foreign body not seen in stomach; Has advanced into intestines and should be evacuated with time. EGD normal. Clear liquid diet and advance as tolerated. Advised to take small bites, eat slow, and chew well. Ok to go home from PACU when ready for discharge. F/U with GI as needed.

## 2021-02-12 NOTE — ED Provider Notes (Signed)
I saw and evaluated the patient, reviewed the resident's note and I agree with the findings and plan.  EKG Interpretation  Date/Time:  Saturday February 12 2021 05:39:34 EDT Ventricular Rate:  67 PR Interval:  159 QRS Duration: 87 QT Interval:  385 QTC Calculation: 407 R Axis:   50 Text Interpretation: Sinus rhythm Confirmed by Palumbo, April ([id number]) on 02/12/2021 5:41:46 AM  Patient transferred from [facility] for upper endoscopy.  Patient has no complaints at this time.  She has not currently having any pain  Patient is alert and nontoxic.  No respiratory distress.  Upper airway is clear.  Voice is clear.  Upper abdomen soft and nontender.  Agree with plan of management.   Arby Barrette, MD 02/12/21 [phone]

## 2021-02-12 NOTE — H&P (View-Only) (Signed)
Referring Provider: Dr. Wilkie Aye Primary Care Physician:  Patient, No Pcp Per (Inactive) Primary Gastroenterologist: Gentry Fitz  Reason for Consultation:  Foreign body in stomach; Epigastric pain  HPI: Nicole Hobbs is a 45 y.o. female acute onset of epigastric pain while eating yesterday without dysphagia and swallowed a piece of beef with a hard piece in it. Vomited at home twice. No history of melena or hematemesis. History of GERD on Omeprazole prn. No history of food impaction. CT showed a hyperdense object in her stomach concerning for a bone. Daughter with her who helps with translation. COVID negative.  Past Medical History:  Diagnosis Date  . High cholesterol     Past Surgical History:  Procedure Laterality Date  . ABDOMINAL HYSTERECTOMY      Prior to Admission medications   Medication Sig Start Date End Date Taking? Authorizing Provider  acetaminophen (TYLENOL) 500 MG tablet Take 1,000 mg by mouth every 6 (six) hours as needed for mild pain.   Yes [provider]  ezetimibe (ZETIA) 10 MG tablet Take 1 tablet by mouth daily. 08/23/20  Yes [provider]  fluticasone (FLONASE) 50 MCG/ACT nasal spray Place 2 sprays into both nostrils daily as needed for allergies.   Yes [provider]  ibuprofen (ADVIL) 200 MG tablet Take 400 mg by mouth every 6 (six) hours as needed for mild pain.   Yes [provider]  loratadine (CLARITIN) 10 MG tablet Take 10 mg by mouth daily.   Yes [provider]    Scheduled Meds: Continuous Infusions: . sodium chloride    . lactated ringers     PRN Meds:.  Allergies as of 02/12/2021  . (No Known Allergies)    History reviewed. No pertinent family history.  Social History   Socioeconomic History  . Marital status: Married    Spouse name: Not on file  . Number of children: Not on file  . Years of education: Not on file  . Highest education level: Not on file  Occupational History  . Not on  file  Tobacco Use  . Smoking status: Never Smoker  . Smokeless tobacco: Never Used  Vaping Use  . Vaping Use: Never used  Substance and Sexual Activity  . Alcohol use: No  . Drug use: No  . Sexual activity: Not on file  Other Topics Concern  . Not on file  Social History Narrative  . Not on file   Social Determinants of Health   Financial Resource Strain: Not on file  Food Insecurity: Not on file  Transportation Needs: Not on file  Physical Activity: Not on file  Stress: Not on file  Social Connections: Not on file  Intimate Partner Violence: Not on file    Review of Systems: All negative except as stated above in HPI.  Physical Exam: Vital signs: Vitals:   02/12/21 1201 02/12/21 1645  BP: 108/88 (!) 106/59  Pulse: 70 61  Resp: 16 14  Temp:  98.3 F (36.8 C)  SpO2: 100% 100%     General:   Alert,  Well-developed, well-nourished, pleasant and cooperative in NAD Head: normocephalic, atraumatic Eyes: anicteric sclera ENT: oropharynx clear Neck: supple, nontender Lungs:  Clear throughout to auscultation.   No wheezes, crackles, or rhonchi. No acute distress. Heart:  Regular rate and rhythm; no murmurs, clicks, rubs,  or gallops. Abdomen: soft, nontender, nondistended, +BS  Rectal:  Deferred Ext: no edema  GI:  Lab Results: Recent Labs    02/12/21 0545  WBC 7.7  HGB 13.2  HCT 39.2  PLT 224   BMET Recent Labs    02/12/21 0545  NA 136  K 3.3*  CL 104  CO2 23  GLUCOSE 130*  BUN 24*  CREATININE 0.68  CALCIUM 9.6   LFT Recent Labs    02/12/21 0545  PROT 7.0  ALBUMIN 3.9  AST 26  ALT 25  ALKPHOS 39  BILITOT 0.3   PT/INR No results for input(s): LABPROT, INR in the last 72 hours.   Studies/Results: CT ABDOMEN PELVIS W CONTRAST  Result Date: 02/12/2021 CLINICAL DATA:  45 year old female with epigastric abdominal pain acute onset 3 hours ago. EXAM: CT ABDOMEN AND PELVIS WITH CONTRAST TECHNIQUE: Multidetector CT imaging of the abdomen and  pelvis was performed using the standard protocol following bolus administration of intravenous contrast. CONTRAST:  OMNIPAQUE IOHEXOL 300 MG/ML  SOLN COMPARISON:  CT Abdomen and Pelvis 02/25/2016. FINDINGS: Lower chest: Negative; minor lung base atelectasis. Hepatobiliary: Negative liver and gallbladder. No bile duct enlargement. Pancreas: Negative. Spleen: Negative. Adrenals/Urinary Tract: Normal adrenal glands. Symmetric renal enhancement, and on delayed images there is symmetric contrast excretion. There is asymmetric prominence of the right renal pelvis, although similar to the 2017 CT. And there is no asymmetric hydroureter, urinary calculus, or other obstructing etiology identified. Unremarkable bladder. Stomach/Bowel: Large bowel loops appear negative aside from retained stool. However, the appendix appears larger than normal, up to 8 mm diameter (series 2, image 55 and coronal image 57. However, it measured 7 mm in 2017. There is suggestion of mucosal hyperenhancement of the distal appendix (coronal image 46), but no convincing para appendiceal inflammation at this time. No appendicoliths. Terminal ileum and other distal small bowel loops appear negative. No dilated small bowel. There is a linear 17-18 mm hyperdense object in the distal stomach on series 2, image 25. No gastroduodenal inflammation. No free air or free fluid. Vascular/Lymphatic: Major arterial structures appear patent and normal. Portal venous system is patent. No lymphadenopathy. Reproductive: Absent uterus now.  Ovaries are within normal limits. Other: No pelvic free fluid. Musculoskeletal: Mild lower lumbar facet hypertrophy, disc bulging. No acute osseous abnormality identified. IMPRESSION: 1. Linear roughly 17 mm hyperdense foreign body projects in the distal stomach. Query Retained Chicken Bone. No gastro duodenal inflammation. 2. Appendix appears mildly enlarged although not significantly changed from 2017. Suggestion of mucosal  hyperenhancement of the appendix, but no convincing regional inflammation. Recommend clinical surveillance, and if the patient's clinical course is equivocal then repeat CT Abdomen and Pelvis in 12-24 hours would be most helpful to exclude early appendicitis. 3. Asymmetric right renal pelvis but no urinary calculus or secondary changes of obstructive uropathy. Favor normal anatomic variant. Electronically Signed   By: Odessa Fleming M.D.   On: 02/12/2021 08:15   DG Chest Portable 1 View  Result Date: 02/12/2021 CLINICAL DATA:  45 year old female with pain. EXAM: PORTABLE CHEST 1 VIEW COMPARISON:  Chest radiographs 08/09/2017. FINDINGS: Portable AP view at 0617 hours. Normal lung volumes and mediastinal contours. Visualized tracheal air column is within normal limits. Allowing for portable technique the lungs are clear. No pneumothorax. Paucity of bowel gas in the upper abdomen. No osseous abnormality identified. IMPRESSION: Negative portable chest. Electronically Signed   By: Odessa Fleming M.D.   On: 02/12/2021 07:42   US Abdomen Limited RUQ (LIVER/GB)  Result Date: 02/12/2021 CLINICAL DATA:  45 year old female with RIGHT abdominal pain. EXAM: ULTRASOUND ABDOMEN LIMITED RIGHT UPPER QUADRANT COMPARISON:  CT performed today and prior studies FINDINGS: Gallbladder:  The gallbladder is unremarkable. There is no evidence of cholelithiasis or cholecystitis. Common bile duct: Diameter: 3 mm. No intrahepatic or extrahepatic biliary dilatation identified. Liver: No focal lesion identified. Within normal limits in parenchymal echogenicity. Portal vein is patent on color Doppler imaging with normal direction of blood flow towards the liver. Other: Fullness of the RIGHT intrarenal collecting system and renal pelvis appear unchanged from prior CTs dating back to 2017. IMPRESSION: 1. No acute abnormality. 2. Unchanged fullness of the RIGHT intrarenal collecting system and RIGHT renal pelvis prior CTs. Electronically Signed   By:  Jeffrey  Hu M.D.   On: 02/12/2021 09:31    Impression/Plan: Foreign body in stomach question beef or chicken bone needing EGD for removal to avoid lodging in small bowel. Plan for d/c home following EGD.    LOS: 0 days   Jayziah Bankhead C Ahonesty Woodfin  02/12/2021, 6:13 PM  Questions please call [phone]  

## 2021-02-12 NOTE — ED Provider Notes (Addendum)
[facility] EMERGENCY DEPARTMENT Provider Note   CSN: [id number] Arrival date & time: 02/12/21  9758     History Chief Complaint  Patient presents with  . Abdominal Pain    Nicole Hobbs is a 45 y.o. female.  The history is provided by the patient, a relative and the spouse.  Abdominal Pain Pain location:  Epigastric and RUQ Pain quality: sharp   Pain radiates to:  Does not radiate Pain severity:  Severe Onset quality:  Sudden Duration:  2 hours Timing:  Constant Progression:  Unchanged Chronicity:  Recurrent Context: not eating and not trauma   Context comment:  Ate oatmeal after pain started.  Last meal was chicken at 8 pm  Relieved by:  Nothing Worsened by:  Nothing Ineffective treatments:  Eating (oatmeal) Associated symptoms: nausea and vomiting   Associated symptoms: no constipation, no cough, no diarrhea, no dysuria, no fever and no shortness of breath   Risk factors: not elderly   Patient with high cholesterol presents with sudden onset RUQ and epigastric pain with associated nausea and emesis x 2.  Thought it was indigestion and ate oatmeal and this did not help. No f/c/r.  No diarrhea.       Past Medical History:  Diagnosis Date  . High cholesterol     There are no problems to display for this patient.   Past Surgical History:  Procedure Laterality Date  . ABDOMINAL HYSTERECTOMY       OB History   No obstetric history on file.     History reviewed. No pertinent family history.  Social History   Tobacco Use  . Smoking status: Never Smoker  . Smokeless tobacco: Never Used  Substance Use Topics  . Alcohol use: No  . Drug use: No    Home Medications Prior to Admission medications   Medication Sig Start Date End Date Taking? Authorizing Provider  clarithromycin (BIAXIN) 500 MG tablet Take 500 mg by mouth 2 (two) times daily.    [provider]    Allergies    Patient has no known allergies.  Review of Systems    Review of Systems  Constitutional: Negative for fever.  HENT: Negative for congestion.   Eyes: Negative for visual disturbance.  Respiratory: Negative for cough and shortness of breath.   Cardiovascular: Negative for palpitations and leg swelling.  Gastrointestinal: Positive for abdominal pain, nausea and vomiting. Negative for constipation and diarrhea.  Genitourinary: Negative for dysuria.  Musculoskeletal: Negative for arthralgias.  Skin: Negative for rash.  Neurological: Negative for facial asymmetry.  Psychiatric/Behavioral: Negative for agitation.  All other systems reviewed and are negative.   Physical Exam Updated Vital Signs BP (!) 141/88 (BP Location: Right Arm)   Pulse 70   Temp 97.9 F (36.6 C) (Oral)   Resp 16   Ht 5\' 2"  (1.575 m)   Wt 68 kg   SpO2 99%   BMI 27.44 kg/m   Physical Exam Vitals and nursing note reviewed.  Constitutional:      Appearance: Normal appearance. She is not diaphoretic.  HENT:     Head: Normocephalic and atraumatic.     Nose: Nose normal.  Eyes:     Conjunctiva/sclera: Conjunctivae normal.     Pupils: Pupils are equal, round, and reactive to light.  Cardiovascular:     Rate and Rhythm: Normal rate and regular rhythm.     Pulses: Normal pulses.     Heart sounds: Normal heart sounds.  Pulmonary:  Effort: Pulmonary effort is normal.     Breath sounds: Normal breath sounds.  Abdominal:     General: Abdomen is flat.     Palpations: Abdomen is soft.     Tenderness: There is no abdominal tenderness. There is no guarding or rebound.     Comments: Hyperactive BS throughout   Musculoskeletal:        General: Normal range of motion.     Cervical back: Normal range of motion and neck supple.  Skin:    General: Skin is warm and dry.     Capillary Refill: Capillary refill takes less than 2 seconds.  Neurological:     General: No focal deficit present.     Mental Status: She is alert and oriented to person, place, and time.      Deep Tendon Reflexes: Reflexes normal.  Psychiatric:        Mood and Affect: Mood normal.        Behavior: Behavior normal.     ED Results / Procedures / Treatments   Labs (all labs ordered are listed, but only abnormal results are displayed) Results for orders placed or performed during the hospital encounter of 02/12/21  CBC with Differential/Platelet  Result Value Ref Range   WBC 7.7 4.0 - 10.5 K/uL   RBC 4.82 3.87 - 5.11 MIL/uL   Hemoglobin 13.2 12.0 - 15.0 g/dL   HCT 62.0 35.5 - 97.4 %   MCV 81.3 80.0 - 100.0 fL   MCH 27.4 26.0 - 34.0 pg   MCHC 33.7 30.0 - 36.0 g/dL   RDW 16.3 84.5 - 36.4 %   Platelets 224 150 - 400 K/uL   nRBC 0.0 0.0 - 0.2 %   Neutrophils Relative % 61 %   Neutro Abs 4.6 1.7 - 7.7 K/uL   Lymphocytes Relative 30 %   Lymphs Abs 2.3 0.7 - 4.0 K/uL   Monocytes Relative 7 %   Monocytes Absolute 0.5 0.1 - 1.0 K/uL   Eosinophils Relative 2 %   Eosinophils Absolute 0.1 0.0 - 0.5 K/uL   Basophils Relative 0 %   Basophils Absolute 0.0 0.0 - 0.1 K/uL   Immature Granulocytes 0 %   Abs Immature Granulocytes 0.03 0.00 - 0.07 K/uL  Comprehensive metabolic panel  Result Value Ref Range   Sodium 136 135 - 145 mmol/L   Potassium 3.3 (L) 3.5 - 5.1 mmol/L   Chloride 104 98 - 111 mmol/L   CO2 23 22 - 32 mmol/L   Glucose, Bld 130 (H) 70 - 99 mg/dL   BUN 24 (H) 6 - 20 mg/dL   Creatinine, Ser 6.80 0.44 - 1.00 mg/dL   Calcium 9.6 8.9 - 32.1 mg/dL   Total Protein 7.0 6.5 - 8.1 g/dL   Albumin 3.9 3.5 - 5.0 g/dL   AST 26 15 - 41 U/L   ALT 25 0 - 44 U/L   Alkaline Phosphatase 39 38 - 126 U/L   Total Bilirubin 0.3 0.3 - 1.2 mg/dL   GFR, Estimated >22 >48 mL/min   Anion gap 9 5 - 15  Lipase, blood  Result Value Ref Range   Lipase 44 11 - 51 U/L  hCG, serum, qualitative  Result Value Ref Range   Preg, Serum NEGATIVE NEGATIVE   No results found.  EKG EKG Interpretation  Date/Time:  Saturday Amayia Ciano 30 2022 05:39:34 EDT Ventricular Rate:  67 PR  Interval:  159 QRS Duration: 87 QT Interval:  385 QTC Calculation: 407 R Axis:  50 Text Interpretation: Sinus rhythm Confirmed by Nicanor Alcon, Jalien Weakland ([id number]) on 02/12/2021 5:41:46 AM   Radiology No results found.  Procedures Procedures   Medications Ordered in ED Medications  fentaNYL (SUBLIMAZE) injection 50 mcg (has no administration in time range)  ondansetron (ZOFRAN) injection 4 mg (4 mg Intravenous Given 02/12/21 0548)  alum & mag hydroxide-simeth (MAALOX/MYLANTA) 200-200-20 MG/5ML suspension 30 mL (30 mLs Oral Given 02/12/21 5277)    ED Course  I have reviewed the triage vital signs and the nursing notes.  Pertinent labs & imaging results that were available during my care of the patient were reviewed by me and considered in my medical decision making (see chart for details).    Nicole Hobbs was evaluated in Emergency Department on 02/12/2021 for the symptoms described in the history of present illness. She was evaluated in the context of the global COVID-19 pandemic, which necessitated consideration that the patient might be at risk for infection with the SARS-CoV-2 virus that causes COVID-19. Institutional protocols and algorithms that pertain to the evaluation of patients at risk for COVID-19 are in a state of rapid change based on information released by regulatory bodies including the CDC and federal and state organizations. These policies and algorithms were followed during the patient's care in the ED.  Final Clinical Impression(s) / ED Diagnoses Final diagnoses:  Epigastric pain    Signed out to Dr. Wilkie Aye pending delta troponin and abdominal imaging, and ultrasound.            Mikaiya Tramble, MD 02/12/21 [phone]

## 2021-02-12 NOTE — Anesthesia Postprocedure Evaluation (Signed)
Anesthesia Post Note  Patient: Nicole Hobbs  Procedure(s) Performed: ESOPHAGOGASTRODUODENOSCOPY (EGD) WITH PROPOFOL (N/A )     Patient location during evaluation: PACU Anesthesia Type: General Level of consciousness: awake Pain management: pain level controlled Vital Signs Assessment: post-procedure vital signs reviewed and stable Respiratory status: spontaneous breathing, nonlabored ventilation, respiratory function stable and patient connected to nasal cannula oxygen Cardiovascular status: blood pressure returned to baseline and stable Postop Assessment: no apparent nausea or vomiting Anesthetic complications: no   No complications documented.  Last Vitals:  Vitals:   02/12/21 1902 02/12/21 1911  BP: 132/78 127/83  Pulse: 84 81  Resp: 19 (!) 23  Temp:  36.7 C  SpO2: 98% 98%    Last Pain:  Vitals:   02/12/21 1911  TempSrc:   PainSc: 0-No pain                 Assata Juncaj P Kadi Hession

## 2021-02-12 NOTE — Transfer of Care (Signed)
Immediate Anesthesia Transfer of Care Note  Patient: Nicole Hobbs  Procedure(s) Performed: ESOPHAGOGASTRODUODENOSCOPY (EGD) WITH PROPOFOL (N/A )  Patient Location: PACU  Anesthesia Type:General  Level of Consciousness: drowsy  Airway & Oxygen Therapy: Patient Spontanous Breathing and Patient connected to nasal cannula oxygen  Post-op Assessment: Report given to RN and Post -op Vital signs reviewed and stable  Post vital signs: Reviewed and stable  Last Vitals:  Vitals Value Taken Time  BP 130/79 02/12/21 1850  Temp 36.7 C 02/12/21 1810  Pulse 75 02/12/21 1854  Resp 17 02/12/21 1854  SpO2 98 % 02/12/21 1854  Vitals shown include unvalidated device data.  Last Pain:  Vitals:   02/12/21 1810  TempSrc: Oral  PainSc: 0-No pain         Complications: No complications documented.

## 2021-02-12 NOTE — Discharge Instructions (Signed)

## 2021-02-12 NOTE — Consult Note (Signed)
Referring Provider: Dr. Wilkie Aye Primary Care Physician:  Patient, No Pcp Per (Inactive) Primary Gastroenterologist: Gentry Fitz  Reason for Consultation:  Foreign body in stomach; Epigastric pain  HPI: Nicole Hobbs is a 45 y.o. female acute onset of epigastric pain while eating yesterday without dysphagia and swallowed a piece of beef with a hard piece in it. Vomited at home twice. No history of melena or hematemesis. History of GERD on Omeprazole prn. No history of food impaction. CT showed a hyperdense object in her stomach concerning for a bone. Daughter with her who helps with translation. COVID negative.  Past Medical History:  Diagnosis Date  . High cholesterol     Past Surgical History:  Procedure Laterality Date  . ABDOMINAL HYSTERECTOMY      Prior to Admission medications   Medication Sig Start Date End Date Taking? Authorizing Provider  acetaminophen (TYLENOL) 500 MG tablet Take 1,000 mg by mouth every 6 (six) hours as needed for mild pain.   Yes [provider]  ezetimibe (ZETIA) 10 MG tablet Take 1 tablet by mouth daily. 08/23/20  Yes [provider]  fluticasone (FLONASE) 50 MCG/ACT nasal spray Place 2 sprays into both nostrils daily as needed for allergies.   Yes [provider]  ibuprofen (ADVIL) 200 MG tablet Take 400 mg by mouth every 6 (six) hours as needed for mild pain.   Yes [provider]  loratadine (CLARITIN) 10 MG tablet Take 10 mg by mouth daily.   Yes [provider]    Scheduled Meds: Continuous Infusions: . sodium chloride    . lactated ringers     PRN Meds:.  Allergies as of 02/12/2021  . (No Known Allergies)    History reviewed. No pertinent family history.  Social History   Socioeconomic History  . Marital status: Married    Spouse name: Not on file  . Number of children: Not on file  . Years of education: Not on file  . Highest education level: Not on file  Occupational History  . Not on  file  Tobacco Use  . Smoking status: Never Smoker  . Smokeless tobacco: Never Used  Vaping Use  . Vaping Use: Never used  Substance and Sexual Activity  . Alcohol use: No  . Drug use: No  . Sexual activity: Not on file  Other Topics Concern  . Not on file  Social History Narrative  . Not on file   Social Determinants of Health   Financial Resource Strain: Not on file  Food Insecurity: Not on file  Transportation Needs: Not on file  Physical Activity: Not on file  Stress: Not on file  Social Connections: Not on file  Intimate Partner Violence: Not on file    Review of Systems: All negative except as stated above in HPI.  Physical Exam: Vital signs: Vitals:   02/12/21 1201 02/12/21 1645  BP: 108/88 (!) 106/59  Pulse: 70 61  Resp: 16 14  Temp:  98.3 F (36.8 C)  SpO2: 100% 100%     General:   Alert,  Well-developed, well-nourished, pleasant and cooperative in NAD Head: normocephalic, atraumatic Eyes: anicteric sclera ENT: oropharynx clear Neck: supple, nontender Lungs:  Clear throughout to auscultation.   No wheezes, crackles, or rhonchi. No acute distress. Heart:  Regular rate and rhythm; no murmurs, clicks, rubs,  or gallops. Abdomen: soft, nontender, nondistended, +BS  Rectal:  Deferred Ext: no edema  GI:  Lab Results: Recent Labs    02/12/21 0545  WBC 7.7  HGB 13.2  HCT 39.2  PLT 224   BMET Recent Labs    02/12/21 0545  NA 136  K 3.3*  CL 104  CO2 23  GLUCOSE 130*  BUN 24*  CREATININE 0.68  CALCIUM 9.6   LFT Recent Labs    02/12/21 0545  PROT 7.0  ALBUMIN 3.9  AST 26  ALT 25  ALKPHOS 39  BILITOT 0.3   PT/INR No results for input(s): LABPROT, INR in the last 72 hours.   Studies/Results: CT ABDOMEN PELVIS W CONTRAST  Result Date: 02/12/2021 CLINICAL DATA:  45 year old female with epigastric abdominal pain acute onset 3 hours ago. EXAM: CT ABDOMEN AND PELVIS WITH CONTRAST TECHNIQUE: Multidetector CT imaging of the abdomen and  pelvis was performed using the standard protocol following bolus administration of intravenous contrast. CONTRAST:  OMNIPAQUE IOHEXOL 300 MG/ML  SOLN COMPARISON:  CT Abdomen and Pelvis 02/25/2016. FINDINGS: Lower chest: Negative; minor lung base atelectasis. Hepatobiliary: Negative liver and gallbladder. No bile duct enlargement. Pancreas: Negative. Spleen: Negative. Adrenals/Urinary Tract: Normal adrenal glands. Symmetric renal enhancement, and on delayed images there is symmetric contrast excretion. There is asymmetric prominence of the right renal pelvis, although similar to the 2017 CT. And there is no asymmetric hydroureter, urinary calculus, or other obstructing etiology identified. Unremarkable bladder. Stomach/Bowel: Large bowel loops appear negative aside from retained stool. However, the appendix appears larger than normal, up to 8 mm diameter (series 2, image 55 and coronal image 57. However, it measured 7 mm in 2017. There is suggestion of mucosal hyperenhancement of the distal appendix (coronal image 46), but no convincing para appendiceal inflammation at this time. No appendicoliths. Terminal ileum and other distal small bowel loops appear negative. No dilated small bowel. There is a linear 17-18 mm hyperdense object in the distal stomach on series 2, image 25. No gastroduodenal inflammation. No free air or free fluid. Vascular/Lymphatic: Major arterial structures appear patent and normal. Portal venous system is patent. No lymphadenopathy. Reproductive: Absent uterus now.  Ovaries are within normal limits. Other: No pelvic free fluid. Musculoskeletal: Mild lower lumbar facet hypertrophy, disc bulging. No acute osseous abnormality identified. IMPRESSION: 1. Linear roughly 17 mm hyperdense foreign body projects in the distal stomach. Query Retained Chicken Bone. No gastro duodenal inflammation. 2. Appendix appears mildly enlarged although not significantly changed from 2017. Suggestion of mucosal  hyperenhancement of the appendix, but no convincing regional inflammation. Recommend clinical surveillance, and if the patient's clinical course is equivocal then repeat CT Abdomen and Pelvis in 12-24 hours would be most helpful to exclude early appendicitis. 3. Asymmetric right renal pelvis but no urinary calculus or secondary changes of obstructive uropathy. Favor normal anatomic variant. Electronically Signed   By: Odessa Fleming M.D.   On: 02/12/2021 08:15   DG Chest Portable 1 View  Result Date: 02/12/2021 CLINICAL DATA:  45 year old female with pain. EXAM: PORTABLE CHEST 1 VIEW COMPARISON:  Chest radiographs 08/09/2017. FINDINGS: Portable AP view at 0617 hours. Normal lung volumes and mediastinal contours. Visualized tracheal air column is within normal limits. Allowing for portable technique the lungs are clear. No pneumothorax. Paucity of bowel gas in the upper abdomen. No osseous abnormality identified. IMPRESSION: Negative portable chest. Electronically Signed   By: Odessa Fleming M.D.   On: 02/12/2021 07:42   US Abdomen Limited RUQ (LIVER/GB)  Result Date: 02/12/2021 CLINICAL DATA:  45 year old female with RIGHT abdominal pain. EXAM: ULTRASOUND ABDOMEN LIMITED RIGHT UPPER QUADRANT COMPARISON:  CT performed today and prior studies FINDINGS: Gallbladder:  The gallbladder is unremarkable. There is no evidence of cholelithiasis or cholecystitis. Common bile duct: Diameter: 3 mm. No intrahepatic or extrahepatic biliary dilatation identified. Liver: No focal lesion identified. Within normal limits in parenchymal echogenicity. Portal vein is patent on color Doppler imaging with normal direction of blood flow towards the liver. Other: Fullness of the RIGHT intrarenal collecting system and renal pelvis appear unchanged from prior CTs dating back to 2017. IMPRESSION: 1. No acute abnormality. 2. Unchanged fullness of the RIGHT intrarenal collecting system and RIGHT renal pelvis prior CTs. Electronically Signed   By:  Harmon Pier M.D.   On: 02/12/2021 09:31    Impression/Plan: Foreign body in stomach question beef or chicken bone needing EGD for removal to avoid lodging in small bowel. Plan for d/c home following EGD.    LOS: 0 days   [name]  02/12/2021, 6:13 PM  Questions please call [phone]

## 2021-02-12 NOTE — ED Notes (Signed)
Pt is transferred ED to ED by private vehicle for procedure. Saline lock  in place , secured and pt made aware to drive straight to the Ed .

## 2021-02-12 NOTE — ED Provider Notes (Signed)
Assumed patient care by previous provider.  Please refer to their note for the full HPI.  Brief this is a 45 year old female who presented with epigastric discomfort and nausea/vomiting.  Cardiac work-up thus far has been negative.  Patient is pending repeat troponin, CT the abdomen pelvis and ultrasound of the RUQ.Marland Kitchen Physical Exam  BP 108/88   Pulse 70   Temp 97.9 F (36.6 C) (Oral)   Resp 16   Ht 5\' 2"  (1.575 m)   Wt 68 kg   SpO2 100%   BMI 27.44 kg/m   Physical Exam Vitals and nursing note reviewed.  Constitutional:      Appearance: Normal appearance.  HENT:     Head: Normocephalic.     Mouth/Throat:     Mouth: Mucous membranes are moist.  Cardiovascular:     Rate and Rhythm: Normal rate.  Pulmonary:     Effort: Pulmonary effort is normal. No respiratory distress.  Abdominal:     Palpations: Abdomen is soft.     Tenderness: There is abdominal tenderness in the epigastric area. There is no guarding.  Skin:    General: Skin is warm.  Neurological:     Mental Status: She is alert and oriented to person, place, and time. Mental status is at baseline.  Psychiatric:        Mood and Affect: Mood normal.     ED Course/Procedures     Procedures  MDM  Cardiac work-up is negative, CAT scan identifies a 17 mm long linear finding in the stomach, concerning for possible bone.  Patient is fasting for religious reasons.  She admits that at 8 PM she had a chicken breast (does not believe there is any bone) and that around 11 PM took a "bite of beef with a crunchy white part attached to it".  Patient states that she does this all the time, does not believe that the part was bone.  Did not have painful swallowing.  Denies any throat pain.  Spoke with on-call GI Dr. who agrees with transfer to [facility] ER for evaluation and possible endoscopy.  Concerned that bone could be sharp and cause perforation.  Patient and daughter agree with this plan, she will go by private vehicle.  [facility]  is excepting at [facility], ER.  Patient stable at time of transfer, vitals are normal.       [facility], DO 02/12/21 1211

## 2021-02-12 NOTE — ED Triage Notes (Signed)
Reports right upper abd pain that started suddenly tonight. Vomited x2.

## 2021-02-12 NOTE — Interval H&P Note (Signed)
History and Physical Interval Note:  02/12/2021 6:19 PM  Nicole Hobbs  has presented today for surgery, with the diagnosis of Foreign Body in Stomach.  The various methods of treatment have been discussed with the patient and family. After consideration of risks, benefits and other options for treatment, the patient has consented to  Procedure(s): ESOPHAGOGASTRODUODENOSCOPY (EGD) WITH PROPOFOL (N/A) as a surgical intervention.  The patient's history has been reviewed, patient examined, no change in status, stable for surgery.  I have reviewed the patient's chart and labs.  Questions were answered to the patient's satisfaction.     [name]

## 2021-02-12 NOTE — Op Note (Signed)
[facility] Ed Patient Name: Nicole Hobbs Procedure Date : 02/12/2021 MRN: [id number] Attending MD: [name] , MD Date of Birth: 1975/10/21 CSN: [id number] Age: 45 Admit Type: Emergency Department Procedure:                Upper GI endoscopy Indications:              Epigastric abdominal pain, Foreign body in the                            stomach Providers:                [name], MD, Rogue Jury, RN, Lawson Radar, Technician, Tressia Miners, CRNA Referring MD:             ER Medicines:                Propofol per Anesthesia, Monitored Anesthesia Care;                            Patient intubated prior to procedure for airway                            protection Complications:            No immediate complications. Estimated Blood Loss:     Estimated blood loss: none. Procedure:                Pre-Anesthesia Assessment:                           - Prior to the procedure, a History and Physical                            was performed, and patient medications and                            allergies were reviewed. The patient's tolerance of                            previous anesthesia was also reviewed. The risks                            and benefits of the procedure and the sedation                            options and risks were discussed with the patient.                            All questions were answered, and informed consent                            was obtained. Prior Anticoagulants: The patient has  taken no previous anticoagulant or antiplatelet                            agents. ASA Grade Assessment: II - A patient with                            mild systemic disease. After reviewing the risks                            and benefits, the patient was deemed in                            satisfactory condition to undergo the procedure.                           After  obtaining informed consent, the endoscope was                            passed under direct vision. Throughout the                            procedure, the patient's blood pressure, pulse, and                            oxygen saturations were monitored continuously. The                            GIF-H190 ([id number]) Olympus gastroscope was                            introduced through the mouth, and advanced to the                            second part of duodenum. The upper GI endoscopy was                            accomplished without difficulty. The patient                            tolerated the procedure well. Scope In: Scope Out: Findings:      The examined esophagus was normal.      The Z-line was regular and was found 40 cm from the incisors.      The entire examined stomach was normal.      There is no endoscopic evidence of foreign body in the entire examined       stomach.      The cardia and gastric fundus were normal on retroflexion.      The examined duodenum was normal. Impression:               - Normal esophagus.                           - Z-line regular, 40 cm from the incisors.                           -  Normal stomach.                           - Normal examined duodenum.                           - No specimens collected. Recommendation:           - Patient has a contact number available for                            emergencies. The signs and symptoms of potential                            delayed complications were discussed with the                            patient. Return to normal activities tomorrow.                            Written discharge instructions were provided to the                            patient.                           - Advance diet as tolerated and clear liquid diet.                           - Return to my office PRN.                           - Take small bites of food, chew well, and eat slow. Procedure Code(s):         --- Professional ---                           [phone], Esophagogastroduodenoscopy, flexible,                            transoral; diagnostic, including collection of                            specimen(s) by brushing or washing, when performed                            (separate procedure) Diagnosis Code(s):        --- Professional ---                           J00.9FGH, Foreign body in stomach, initial encounter                           R10.13, Epigastric pain CPT copyright 2019 [employer]. All rights reserved. The codes documented in this report are preliminary and upon coder review may  be revised to meet current compliance requirements. [name], MD 02/12/2021 6:49:04 PM This report has been signed  electronically. Number of Addenda: 0

## 2021-02-12 NOTE — Anesthesia Procedure Notes (Signed)
Procedure Name: Intubation Date/Time: 02/12/2021 4:29 PM Performed by: Reece Agar, CRNA Pre-anesthesia Checklist: Patient identified, Emergency Drugs available, Suction available and Patient being monitored Patient Re-evaluated:Patient Re-evaluated prior to induction Oxygen Delivery Method: Circle System Utilized Preoxygenation: Pre-oxygenation with 100% oxygen Induction Type: IV induction, Rapid sequence and Cricoid Pressure applied Ventilation: Mask ventilation without difficulty Laryngoscope Size: Mac and 3 Grade View: Grade I Tube type: Oral Tube size: 7.0 mm Number of attempts: 1 Airway Equipment and Method: Stylet Placement Confirmation: ETT inserted through vocal cords under direct vision,  positive ETCO2 and breath sounds checked- equal and bilateral Secured at: 21 cm Tube secured with: Tape Dental Injury: Teeth and Oropharynx as per pre-operative assessment

## 2021-02-14 ENCOUNTER — Encounter (HOSPITAL_COMMUNITY): Payer: Self-pay | Admitting: Gastroenterology
